# Patient Record
Sex: Female | Born: 1985
Health system: Southern US, Community
[De-identification: ages and names within clinical notes are randomized; demographics above are authoritative.]

---

## 2010-09-22 LAB — BASIC METABOLIC PANEL
BUN: 12 mg/dL (ref 4–21)
Creatinine: 0.8 mg/dL (ref 0.5–1.1)
GLUCOSE: 82 mg/dL
POTASSIUM: 4.4 mmol/L (ref 3.4–5.3)
SODIUM: 138 mmol/L (ref 137–147)

## 2010-09-22 LAB — LIPID PANEL
Cholesterol: 163 mg/dL (ref 0–200)
HDL: 44 mg/dL (ref 35–70)
LDL CALC: 101 mg/dL
Triglycerides: 91 mg/dL (ref 40–160)

## 2014-02-15 ENCOUNTER — Encounter: Payer: Self-pay | Admitting: Physician Assistant

## 2014-02-15 ENCOUNTER — Ambulatory Visit (INDEPENDENT_AMBULATORY_CARE_PROVIDER_SITE_OTHER): Payer: 59 | Admitting: Physician Assistant

## 2014-02-15 VITALS — BP 106/65 | HR 67 | Temp 97.6°F | Resp 16 | Ht 63.0 in | Wt 103.0 lb

## 2014-02-15 DIAGNOSIS — Z131 Encounter for screening for diabetes mellitus: Secondary | ICD-10-CM

## 2014-02-15 DIAGNOSIS — Z0000 Encounter for general adult medical examination without abnormal findings: Secondary | ICD-10-CM

## 2014-02-15 DIAGNOSIS — Z13220 Encounter for screening for lipoid disorders: Secondary | ICD-10-CM

## 2014-02-15 MED ORDER — LEVONORGEST-ETH ESTRAD 91-DAY 0.15-0.03 MG PO TABS: 1.0000 | ORAL_TABLET | Freq: Every day | ORAL | Status: DC

## 2014-02-15 NOTE — Progress Notes (Signed)
  Subjective:     Alice Reese is a 28 y.o. female and is here for a comprehensive physical exam. The patient reports no problems.  History   Social History  . Marital Status: Single    Spouse Name: N/A    Number of Children: N/A  . Years of Education: N/A   Occupational History  . Not on file.   Social History Main Topics  . Smoking status: Never Smoker   . Smokeless tobacco: Not on file  . Alcohol Use: Yes  . Drug Use: No  . Sexual Activity: Not Currently   Other Topics Concern  . Not on file   Social History Narrative  . No narrative on file   Health Maintenance  Topic Date Due  . Pap Smear  05/17/2014  . Influenza Vaccine  12/16/2014  . Tetanus/tdap  05/17/2022    The following portions of the patient's history were reviewed and updated as appropriate: allergies, current medications, past family history, past medical history, past social history, past surgical history and problem list.  Review of Systems A comprehensive review of systems was negative.   Objective:    BP 106/65  Pulse 67  Temp(Src) 97.6 F (36.4 C) (Oral)  Resp 16  Ht 5\' 3"  (1.6 m)  Wt 103 lb (46.72 kg)  BMI 18.25 kg/m2  SpO2 97%  LMP 02/15/2014 General appearance: alert, cooperative and appears stated age Head: Normocephalic, without obvious abnormality, atraumatic Eyes: conjunctivae/corneas clear. PERRL, EOM's intact. Fundi benign. Ears: normal TM's and external ear canals both ears Nose: Nares normal. Septum midline. Mucosa normal. No drainage or sinus tenderness. Throat: lips, mucosa, and tongue normal; teeth and gums normal Neck: no adenopathy, no carotid bruit, no JVD, supple, symmetrical, trachea midline and thyroid not enlarged, symmetric, no tenderness/mass/nodules Back: symmetric, no curvature. ROM normal. No CVA tenderness. Lungs: clear to auscultation bilaterally Heart: regular rate and rhythm, S1, S2 normal, no murmur, click, rub or gallop Abdomen: soft, non-tender; bowel  sounds normal; no masses,  no organomegaly Extremities: extremities normal, atraumatic, no cyanosis or edema Pulses: 2+ and symmetric Skin: Skin color, texture, turgor normal. No rashes or lesions Lymph nodes: Cervical, supraclavicular, and axillary nodes normal. Neurologic: Grossly normal    Assessment:    Healthy female exam.      Plan:    CPE- printed fasting labs. Per pt has had labs drawn for lipids about one year ago.if she can find a bring in she does not need to get redrawn. Up to date on Tdap and flu vaccines. Pap up to date. Recommended calcium 1200mg  and vitamin D 800 units. Encouraged regular exercise.  See After Visit Summary for Counseling Recommendations

## 2014-02-15 NOTE — Patient Instructions (Signed)

## 2014-02-22 ENCOUNTER — Encounter: Payer: Self-pay | Admitting: Physician Assistant

## 2014-02-22 DIAGNOSIS — R748 Abnormal levels of other serum enzymes: Secondary | ICD-10-CM | POA: Insufficient documentation

## 2014-02-22 LAB — COMPLETE METABOLIC PANEL WITH GFR
ALBUMIN: 4 g/dL (ref 3.5–5.2)
ALT: 105 U/L — ABNORMAL HIGH (ref 0–35)
AST: 75 U/L — AB (ref 0–37)
Alkaline Phosphatase: 58 U/L (ref 39–117)
BUN: 19 mg/dL (ref 6–23)
CALCIUM: 8.9 mg/dL (ref 8.4–10.5)
CHLORIDE: 105 meq/L (ref 96–112)
CO2: 26 mEq/L (ref 19–32)
Creat: 0.79 mg/dL (ref 0.50–1.10)
GFR, Est African American: >89 mL/min
GLUCOSE: 80 mg/dL (ref 70–99)
POTASSIUM: 3.9 meq/L (ref 3.5–5.3)
Sodium: 139 mEq/L (ref 135–145)
Total Bilirubin: 0.4 mg/dL (ref 0.2–1.2)
Total Protein: 6.7 g/dL (ref 6.0–8.3)

## 2014-02-22 LAB — LIPID PANEL
CHOLESTEROL: 198 mg/dL (ref 0–200)
HDL: 64 mg/dL (ref >39)
LDL Cholesterol: 115 mg/dL — ABNORMAL HIGH (ref 0–99)
Total CHOL/HDL Ratio: 3.1 Ratio
Triglycerides: 96 mg/dL (ref <150)
VLDL: 19 mg/dL (ref 0–40)

## 2014-05-01 ENCOUNTER — Other Ambulatory Visit: Payer: Self-pay | Admitting: Physician Assistant

## 2014-05-01 DIAGNOSIS — R945 Abnormal results of liver function studies: Principal | ICD-10-CM

## 2014-05-01 DIAGNOSIS — R7989 Other specified abnormal findings of blood chemistry: Secondary | ICD-10-CM

## 2014-05-02 LAB — COMPLETE METABOLIC PANEL WITH GFR
ALK PHOS: 39 U/L (ref 39–117)
ALT: 11 U/L (ref 0–35)
AST: 19 U/L (ref 0–37)
Albumin: 4 g/dL (ref 3.5–5.2)
BUN: 17 mg/dL (ref 6–23)
CO2: 20 mEq/L (ref 19–32)
CREATININE: 0.64 mg/dL (ref 0.50–1.10)
Calcium: 8.8 mg/dL (ref 8.4–10.5)
Chloride: 107 mEq/L (ref 96–112)
GFR, Est African American: >89 mL/min
GLUCOSE: 81 mg/dL (ref 70–99)
Potassium: 4 mEq/L (ref 3.5–5.3)
Sodium: 135 mEq/L (ref 135–145)
TOTAL PROTEIN: 6.7 g/dL (ref 6.0–8.3)
Total Bilirubin: 0.3 mg/dL (ref 0.2–1.2)

## 2014-08-20 ENCOUNTER — Encounter: Payer: Self-pay | Admitting: Physician Assistant

## 2014-09-11 ENCOUNTER — Encounter: Payer: Self-pay | Admitting: Physician Assistant

## 2014-09-11 ENCOUNTER — Ambulatory Visit (INDEPENDENT_AMBULATORY_CARE_PROVIDER_SITE_OTHER): Payer: 59 | Admitting: Physician Assistant

## 2014-09-11 VITALS — BP 104/67 | HR 82 | Ht 63.0 in | Wt 105.0 lb

## 2014-09-11 DIAGNOSIS — B070 Plantar wart: Secondary | ICD-10-CM

## 2014-09-11 MED ORDER — SALICYLIC ACID 26 % EX LIQD: CUTANEOUS | Status: DC

## 2014-09-11 NOTE — Progress Notes (Signed)
   Subjective:    Patient ID: Alice Reese, female    DOB: 01/26/1986, 29 y.o.   MRN: [id number]  HPI Pt presents to the clinic with plantar wart on the lateral heel of patient for past year. She has tried multiple OTC compound W, freezes OTC, and duct tape. She even went to urgent care and they used cryotherapy but the wart just came back. She has tried to cut it off herself but not successful. Causes her pain and irritation.    Review of Systems  All other systems reviewed and are negative.      Objective:   Physical Exam  Skin:             Assessment & Plan:  Plantar wart- will do cryotherapy and suggest to follow up(after healed) with salicylic acid and duct tape. If starting to come back then call and will make dermatology referral for surgical removal.  HO given.   Cryotherapy Procedure Note  Pre-operative Diagnosis: plantar wart   Post-operative Diagnosis: plantar wart  Locations: right lateral heel  Indications:  Pain and irritation   Procedure Details  History of allergy to iodine: no. Pacemaker? no.  Patient informed of risks (permanent scarring, infection, light or dark discoloration, bleeding, infection, weakness, numbness and recurrence of the lesion) and benefits of the procedure and verbal informed consent obtained.  The areas are treated with liquid nitrogen therapy, frozen until ice ball extended 3 mm beyond lesion, allowed to thaw, and treated again. The patient tolerated procedure well.  The patient was instructed on post-op care, warned that there may be blister formation, redness and pain. Recommend OTC analgesia as needed for pain.  Condition: Stable  Complications: none.  Plan: 1. Instructed to keep the area dry and covered for 24-48h and clean thereafter. 2. Warning signs of infection were reviewed.   3. Recommended that the patient use OTC analgesics as needed for pain.

## 2014-09-11 NOTE — Patient Instructions (Signed)
Plantar Warts Warts are benign (noncancerous) growths of the outer skin layer. They can occur at any time in life but are most common during childhood and the teen years. Warts can occur on many skin surfaces of the body. When they occur on the underside (sole) of your foot they are called plantar warts. They often emerge in groups with several small warts encircling a larger growth. CAUSES  Human papillomavirus (HPV) is the cause of plantar warts. HPV attacks a break in the skin of the foot. Walking barefoot can lead to exposure to the wart virus. Plantar warts tend to develop over areas of pressure such as the heel and ball of the foot. Plantar warts often grow into the deeper layers of skin. They may spread to other areas of the sole but cannot spread to other areas of the body. SYMPTOMS  You may also notice a growth on the undersurface of your foot. The wart may grow directly into the sole of the foot, or rise above the surface of the skin on the sole of the foot, or both. They are most often flat from pressure. Warts generally do not cause itching but may cause pain in the area of the wart when you put weight on your foot. DIAGNOSIS  Diagnosis is made by physical examination. This means your caregiver discovers it while examining your foot.  TREATMENT  There are many ways to treat plantar warts. However, warts are very tough. Sometimes it is difficult to treat them so that they go away completely and do not grow back. Any treatment must be done regularly to work. If left untreated, most plantar warts will eventually disappear over a period of one to two years. Treatments you can do at home include:  Putting duct tape over the top of the wart (occlusion) has been found to be effective over several months. The duct tape should be removed each night and reapplied until the wart has disappeared.  Placing over-the-counter medications on top of the wart to help kill the wart virus and remove the wart  tissue (salicylic acid, cantharidin, and dichloroacetic acid) are useful. These are called keratolytic agents. These medications make the skin soft and gradually layers will shed away. These compounds are usually placed on the wart each night and then covered with a bandage. They are also available in premedicated bandage form. Avoid surrounding skin when applying these liquids as these medications can burn healthy skin. The treatment may take several months of nightly use to be effective.  Cryotherapy to freeze the wart has recently become available over-the-counter for children 4 years and older. This system makes use of a soft narrow applicator connected to a bottle of compressed cold liquid that is applied directly to the wart. This medication can burn healthy skin and should be used with caution.  As with all over-the-counter medications, read the directions carefully before use. Treatments generally done in your caregiver's office include:  Some aggressive treatments may cause discomfort, discoloration, and scarring of the surrounding skin. The risks and benefits of treatment should be discussed with your caregiver.  Freezing the wart with liquid nitrogen (cryotherapy, see above).  Burning the wart with use of very high heat (cautery).  Injecting medication into the wart.  Surgically removing or laser treatment of the wart.  Your caregiver may refer you to a dermatologist for difficult to treat large-sized warts or large numbers of warts. HOME CARE INSTRUCTIONS   Soak the affected area in warm water. Dry the   area completely when you are done. Remove the top layer of softened skin, then apply the chosen topical medication and reapply a bandage.  Remove the bandage daily and file excess wart tissue (pumice stone works well for this purpose). Repeat the entire process daily or every other day for weeks until the plantar wart disappears.  Several brands of salicylic acid pads are available  as over-the-counter remedies.  Pain can be relieved by wearing a donut bandage. This is a bandage with a hole in it. The bandage is put on with the hole over the wart. This helps take the pressure off the wart and gives pain relief. To help prevent plantar warts:  Wear shoes and socks and change them daily.  Keep feet clean and dry.  Check your feet and your children's feet regularly.  Avoid direct contact with warts on other people.  Have growths or changes on your skin checked by your caregiver. Document Released: 07/24/2003 Document Revised: 09/17/2013 Document Reviewed: 01/01/2009 [employer] Patient Information 2015 [employer]. This information is not intended to replace advice given to you by your health care provider. Make sure you discuss any questions you have with your health care provider.  

## 2014-12-23 ENCOUNTER — Telehealth: Payer: Self-pay | Admitting: Family Medicine

## 2014-12-23 NOTE — Telephone Encounter (Signed)
It is unusual.  She may have a condition that affects her ability fo ovulate normally. I encourage her to schedul ean appt with Jade at the end of the month to address.

## 2014-12-23 NOTE — Telephone Encounter (Signed)
Pt added onto PCP's schedule.

## 2014-12-23 NOTE — Telephone Encounter (Signed)
Pt called clinic today stating she stopped her Birth Control Rx in April 2016, she had her menstrual cycle in May 2016, and has not had another one since then. Pt questions if this is "normal" as she weans off the Rx? Prior to stopping her Birth Control Rx, Pt would have her cycle every 3 months. Questioned if Pt has taken a home pregnancy test, states "no, there is no way I could be pregnant." Will route to Provider in office for review.

## 2014-12-31 ENCOUNTER — Ambulatory Visit (INDEPENDENT_AMBULATORY_CARE_PROVIDER_SITE_OTHER): Payer: 59 | Admitting: Family Medicine

## 2014-12-31 ENCOUNTER — Ambulatory Visit (INDEPENDENT_AMBULATORY_CARE_PROVIDER_SITE_OTHER): Payer: 59

## 2014-12-31 ENCOUNTER — Encounter: Payer: Self-pay | Admitting: Family Medicine

## 2014-12-31 VITALS — BP 115/78 | HR 52 | Ht 63.0 in | Wt 110.0 lb

## 2014-12-31 DIAGNOSIS — M25562 Pain in left knee: Secondary | ICD-10-CM

## 2014-12-31 NOTE — Progress Notes (Signed)
Quick Note:  Normal, no changes. ______ 

## 2014-12-31 NOTE — Assessment & Plan Note (Signed)
Concerning for lateral meniscus injury. X-ray pending. Recommend rest. Return in 2 weeks for recheck if not better. At that time will likely perform an MRI.

## 2014-12-31 NOTE — Progress Notes (Signed)
Alice Reese is a 29 y.o. female who presents to [facility]  today for left knee pain. Patient is currently training for a triathlon that will be Sunday. About a week and a half ago she tripped and twisted her left knee. She notes lateral knee pain. She has the pain is worse with activity prolonged standing and walking. She has used a knee brace which have little. No fevers chills nausea vomiting or diarrhea. She notes that she is currently limping.   No past medical history on file. No past surgical history on file. Social History  Substance Use Topics  . Smoking status: Never Smoker   . Smokeless tobacco: Not on file  . Alcohol Use: Yes   ROS as above Medications: Current Outpatient Prescriptions  Medication Sig Dispense Refill  . Salicylic Acid 26 % LIQD Apply once daily for 6 weeks. (Patient not taking: Reported on 12/31/2014) 1 Bottle 1   No current facility-administered medications for this visit.   Allergies  Allergen Reactions  . Sulfa Antibiotics Hives     Exam:  BP 115/78 mmHg  Pulse 52  Ht  (1.6 m)  Wt 110 lb (49.896 kg)  BMI 19.49 kg/m2  LMP 12/25/2014 Gen: Well NAD HEENT: EOMI,  MMM Left knee: Normal-appearing no effusion. Mildly tender to palpation lateral joint line. Normal range of motion Negative Lachman's and anterior posterior drawer test. Stable and nontender to valgus and varus stress. Positive lateral McMurray's test. Antalgic gait Exts: Brisk capillary refill, warm and well perfused.   No results found for this or any previous visit (from the past 24 hour(s)). No results found.   Please see individual assessment and plan sections.

## 2014-12-31 NOTE — Patient Instructions (Signed)
Thank you for coming in today. Take up to 2 aleve twice daily for pain as needed.  I recommend against running in the triathlon Sunday Return in 2 weeks if no 100% better  Meniscus Tear with Phase I Rehab The meniscus is a C-shaped cartilage structure, located in the knee joint between the thigh bone (femur) and the shinbone (tibia). Two menisci are located in each knee joint: the inner and outer meniscus. The meniscus acts as an adapter between the thigh bone and shinbone, allowing them to fit properly together. It also functions as a shock absorber, to reduce the stress placed on the knee joint and to help supply nutrients to the knee joint cartilage. As people age, the meniscus begins to harden and become more vulnerable to injury. Meniscus tears are a common injury, especially in older athletes. Inner meniscus tears are more common than outer meniscus tears.  SYMPTOMS   Pain in the knee, especially with standing or squatting with the affected leg.  Tenderness along the joint line.  Swelling in the knee joint (effusion), usually starting 1 to 2 days after injury.  Locking or catching of the knee joint, causing inability to straighten the knee completely.  Giving way or buckling of the knee. CAUSES  A meniscus tear occurs when a force is placed on the meniscus that is greater than it can handle. Common causes of injury include:  Direct hit (trauma) to the knee.  Twisting, pivoting, or cutting (rapidly changing direction while running), kneeling or squatting.  Without injury, due to aging. RISK INCREASES WITH:  Contact sports (football, rugby).  Sports in which cleats are used with pivoting (soccer, lacrosse) or sports in which good shoe grip and sudden change in direction are required (racquetball, basketball, squash).  Previous knee injury.  Associated knee injury, particularly ligament injuries.  Poor strength and flexibility. PREVENTION  Warm up and stretch properly  before activity.  Maintain physical fitness:  Strength, flexibility, and endurance.  Cardiovascular fitness.  Protect the knee with a brace or elastic bandage.  Wear properly fitted protective equipment (proper cleats for the surface). PROGNOSIS  Sometimes, meniscus tears heal on their own. However, definitive treatment requires surgery, followed by at least 6 weeks of recovery.  RELATED COMPLICATIONS   Recurring symptoms that result in a chronic problem.  Repeated knee injury, especially if sports are resumed too soon after injury or surgery.  Progression of the tear (the tear gets larger), if untreated.  Arthritis of the knee in later years (with or without surgery).  Complications of surgery, including infection, bleeding, injury to nerves (numbness, weakness, paralysis) continued pain, giving way, locking, nonhealing of meniscus (if repaired), need for further surgery, and knee stiffness (loss of motion). TREATMENT  Treatment first involves the use of ice and medicine, to reduce pain and inflammation. You may find using crutches to walk more comfortable. However, it is okay to bear weight on the injured knee, if the pain will allow it. Surgery is often advised as a definitive treatment. Surgery is performed through an incision near the joint (arthroscopically). The torn piece of the meniscus is removed, and if possible the joint cartilage is repaired. After surgery, the joint must be restrained. After restraint, it is important to perform strengthening and stretching exercises to help regain strength and a full range of motion. These exercises may be completed at home or with a therapist.  MEDICATION  If pain medicine is needed, nonsteroidal anti-inflammatory medicines (aspirin and ibuprofen), or other minor pain  relievers (acetaminophen), are often advised.  Do not take pain medicine for 7 days before surgery.  Prescription pain relievers may be given, if your caregiver thinks  they are needed. Use only as directed and only as much as you need. HEAT AND COLD  Cold treatment (icing) should be applied for 10 to 15 minutes every 2 to 3 hours for inflammation and pain, and immediately after activity that aggravates your symptoms. Use ice packs or an ice massage.  Heat treatment may be used before performing stretching and strengthening activities prescribed by your caregiver, physical therapist, or athletic trainer. Use a heat pack or a warm water soak. SEEK MEDICAL CARE IF:   Symptoms get worse or do not improve in 2 weeks, despite treatment.  New, unexplained symptoms develop. (Drugs used in treatment may produce side effects.) EXERCISES RANGE OF MOTION (ROM) AND STRETCHING EXERCISES - Meniscus Tear, Non-operative, Phase I These are some of the initial exercises with which you may start your rehabilitation program, until you see your caregiver again or until your symptoms are resolved. Remember:   These initial exercises are intended to be gentle. They will help you restore motion without increasing any swelling.  Completing these exercises allows less painful movement and prepares you for the more aggressive strengthening exercises in Phase II.  An effective stretch should be held for at least 30 seconds.  A stretch should never be painful. You should only feel a gentle lengthening or release in the stretched tissue. RANGE OF MOTION - Knee Flexion, Active  Lie on your back with both knees straight. (If this causes back discomfort, bend your healthy knee, placing your foot flat on the floor.)  Slowly slide your heel back toward your buttocks until you feel a gentle stretch in the front of your knee or thigh.  Hold for __________ seconds. Slowly slide your heel back to the starting position. Repeat __________ times. Complete this exercise __________ times per day.  RANGE OF MOTION - Knee Flexion and Extension, Active-Assisted  Sit on the edge of a table or  chair with your thighs firmly supported. It may be helpful to place a folded towel under the end of your right / left thigh.  Flexion (bending): Place the ankle of your healthy leg on top of the other ankle. Use your healthy leg to gently bend your right / left knee until you feel a mild tension across the top of your knee.  Hold for __________ seconds.  Extension (straightening): Switch your ankles so your right / left leg is on top. Use your healthy leg to straighten your right / left knee until you feel a mild tension on the backside of your knee.  Hold for __________ seconds. Repeat __________ times. Complete __________ times per day. STRETCH - Knee Flexion, Supine  Lie on the floor with your right / left heel and foot lightly touching the wall. (Place both feet on the wall if you do not use a door frame.)  Without using any effort, allow gravity to slide your foot down the wall slowly until you feel a gentle stretch in the front of your right / left knee.  Hold this stretch for __________ seconds. Then return the leg to the starting position, using your healthy leg for help, if needed. Repeat __________ times. Complete this stretch __________ times per day.  STRETCH - Knee Extension Sitting  Sit with your right / left leg/heel propped on another chair, coffee table, or foot stool.  Allow your  leg muscles to relax, letting gravity straighten out your knee.*  You should feel a stretch behind your right / left knee. Hold this position for __________ seconds. Repeat __________ times. Complete this stretch __________ times per day.  *Your physician, physical therapist or athletic trainer may instruct you place a __________ weight on your thigh, just above your kneecap, to deepen the stretch.  STRENGTHENING EXERCISES - Meniscus Tear, Non-operative, Phase I These exercises may help you when beginning to rehabilitate your injury. They may resolve your symptoms with or without further  involvement from your physician, physical therapist or athletic trainer. While completing these exercises, remember:   Muscles can gain both the endurance and the strength needed for everyday activities through controlled exercises.  Complete these exercises as instructed by your physician, physical therapist or athletic trainer. Progress the resistance and repetitions only as guided. STRENGTH - Quadriceps, Isometrics  Lie on your back with your right / left leg extended and your opposite knee bent.  Gradually tense the muscles in the front of your right / left thigh. You should see either your knee cap slide up toward your hip or increased dimpling just above the knee. This motion will push the back of the knee down toward the floor, mat, or bed on which you are lying.  Hold the muscle as tight as you can, without increasing your pain, for __________ seconds.  Relax the muscles slowly and completely between each repetition. Repeat __________ times. Complete this exercise __________ times per day.  STRENGTH - Quadriceps, Short Arcs   Lie on your back. Place a __________ inch towel roll under your right / left knee, so that the knee bends slightly.  Raise only your lower leg by tightening the muscles in the front of your thigh. Do not allow your thigh to rise.  Hold this position for __________ seconds. Repeat __________ times. Complete this exercise __________ times per day.  OPTIONAL ANKLE WEIGHTS: Begin with ____________________, but DO NOT exceed ____________________. Increase in 1 pound/0.5 kilogram increments. STRENGTH - Quadriceps, Straight Leg Raises  Quality counts! Watch for signs that the quadriceps muscle is working, to be sure you are strengthening the correct muscles and not "cheating" by substituting with healthier muscles.  Lay on your back with your right / left leg extended and your opposite knee bent.  Tense the muscles in the front of your right / left thigh. You  should see either your knee cap slide up or increased dimpling just above the knee. Your thigh may even shake a bit.  Tighten these muscles even more and raise your leg 4 to 6 inches off the floor. Hold for __________ seconds.  Keeping these muscles tense, lower your leg.  Relax the muscles slowly and completely in between each repetition. Repeat __________ times. Complete this exercise __________ times per day.  STRENGTH - Hamstring, Curls   Lay on your stomach with your legs extended. (If you lay on a bed, your feet may hang over the edge.)  Tighten the muscles in the back of your thigh to bend your right / left knee up to 90 degrees. Keep your hips flat on the bed.  Hold this position for __________ seconds.  Slowly lower your leg back to the starting position. Repeat __________ times. Complete this exercise __________ times per day.  STRENGTH - Quadriceps, Squats  Stand in a door frame so that your feet and knees are in line with the frame.  Use your hands for balance, not support,  on the frame.  Slowly lower your weight, bending at the hips and knees. Keep your lower legs upright so that they are parallel with the door frame. Squat only within the range that does not increase your knee pain. Never let your hips drop below your knees.  Slowly return upright, pushing with your legs, not pulling with your hands. Repeat __________ times. Complete this exercise __________ times per day.  STRENGTH - Quad/VMO, Isometric   Sit in a chair with your right / left knee slightly bent. With your fingertips, feel the VMO muscle just above the inside of your knee. The VMO is important in controlling the position of your kneecap.  Keeping your fingertips on this muscle. Without actually moving your leg, attempt to drive your knee down as if straightening your leg. You should feel your VMO tense. If you have a difficult time, you may wish to try the same exercise on your healthy knee  first.  Tense this muscle as hard as you can without increasing any knee pain.  Hold for __________ seconds. Relax the muscles slowly and completely in between each repetition. Repeat __________ times. Complete exercise __________ times per day.  Document Released: 05/17/1998 Document Revised: 07/26/2011 Document Reviewed: 08/15/2008 [facility] Patient Information 2015 [place], [place]. This information is not intended to replace advice given to you by your health care provider. Make sure you discuss any questions you have with your health care provider.

## 2015-01-10 ENCOUNTER — Ambulatory Visit: Payer: 59 | Admitting: Physician Assistant

## 2015-01-13 ENCOUNTER — Ambulatory Visit (INDEPENDENT_AMBULATORY_CARE_PROVIDER_SITE_OTHER): Payer: 59 | Admitting: Physician Assistant

## 2015-01-13 ENCOUNTER — Encounter: Payer: Self-pay | Admitting: Physician Assistant

## 2015-01-13 VITALS — BP 109/69 | HR 67 | Ht 63.0 in | Wt 106.0 lb

## 2015-01-13 DIAGNOSIS — Z113 Encounter for screening for infections with a predominantly sexual mode of transmission: Secondary | ICD-10-CM | POA: Diagnosis not present

## 2015-01-13 DIAGNOSIS — N912 Amenorrhea, unspecified: Secondary | ICD-10-CM

## 2015-01-13 DIAGNOSIS — M25562 Pain in left knee: Secondary | ICD-10-CM

## 2015-01-13 NOTE — Progress Notes (Signed)
   Subjective:    Patient ID: Alice Reese, female    DOB: 1985-10-22, 29 y.o.   MRN: [id number]  HPI  Pt presents to the clinic after 4 weeks of left knee pain after a trip while training for triathlon. She came to see Dr. Denyse Amass. He suspected could be a menscial tear. Pain has not improved. She is still having significant problems bearing weight and any twisting causes pain.left knee feels very unstable. Ibuprofen helps some but does not like to take. She is wearing a brace she got at [employer] and helps. After trauma she did complete her triathlon. Her goal is to run a marathon in November.   Pt would like STD testing after getting out of relationship with partner. No vaginal discharge, pain or symptoms of STD's.   Pt did stop birth control in April and only had one period since. Pt is concerned.     Review of Systems  All other systems reviewed and are negative.      Objective:   Physical Exam  Constitutional: She is oriented to person, place, and time. She appears well-developed and well-nourished.  HENT:  Head: Normocephalic and atraumatic.  Cardiovascular: Normal rate, regular rhythm and normal heart sounds.   Pulmonary/Chest: Effort normal and breath sounds normal.  Musculoskeletal:  Left knee:   Normal ROM  No swelling or effusion Lateral joint line tenderness to palpation.  No valgus or varus tenderness and appears stable.  Negative anterior drawer.  Positive McMurray laterally.   Neurological: She is alert and oriented to person, place, and time.  Psychiatric: She has a normal mood and affect. Her behavior is normal.          Assessment & Plan:  Left knee pain- MRI ordered to see if mensical tear. Stay in Brace. Refrain from exercise.   Amenorrhea- likely due to training for triathlon. Will check TSH. Certainly can consider further testing if no period for greater than 1 year.   STD testing- Hiv, RPR, herpes, GC and chlamydia tested today. Will call with  results.

## 2015-01-14 LAB — HSV(HERPES SMPLX)ABS-I+II(IGG+IGM)-BLD
HERPES SIMPLEX VRS I-IGM AB (EIA): 0.76 {index}
HSV 1 Glycoprotein G Ab, IgG: <0.1 IV
HSV 2 Glycoprotein G Ab, IgG: <0.1 IV

## 2015-01-14 LAB — GC/CHLAMYDIA PROBE AMP, URINE
CHLAMYDIA, SWAB/URINE, PCR: NEGATIVE
GC PROBE AMP, URINE: NEGATIVE

## 2015-01-14 LAB — HIV ANTIBODY (ROUTINE TESTING W REFLEX): HIV 1&2 Ab, 4th Generation: NONREACTIVE

## 2015-01-14 LAB — TSH: TSH: 0.704 u[IU]/mL (ref 0.350–4.500)

## 2015-01-14 LAB — RPR

## 2015-01-21 ENCOUNTER — Ambulatory Visit (INDEPENDENT_AMBULATORY_CARE_PROVIDER_SITE_OTHER): Payer: 59

## 2015-01-21 DIAGNOSIS — M25562 Pain in left knee: Secondary | ICD-10-CM | POA: Diagnosis not present

## 2015-01-23 ENCOUNTER — Telehealth: Payer: Self-pay

## 2015-01-23 NOTE — Telephone Encounter (Signed)
Patient called for Xray results.

## 2015-01-24 ENCOUNTER — Other Ambulatory Visit: Payer: Self-pay | Admitting: Physician Assistant

## 2015-01-24 ENCOUNTER — Encounter: Payer: Self-pay | Admitting: Physician Assistant

## 2015-01-24 DIAGNOSIS — M94269 Chondromalacia, unspecified knee: Secondary | ICD-10-CM | POA: Insufficient documentation

## 2015-01-24 DIAGNOSIS — M94262 Chondromalacia, left knee: Secondary | ICD-10-CM

## 2015-01-24 NOTE — Telephone Encounter (Signed)
Sent results to [facility] to call.

## 2015-02-05 ENCOUNTER — Encounter: Payer: Self-pay | Admitting: Physical Therapy

## 2015-02-05 ENCOUNTER — Ambulatory Visit (INDEPENDENT_AMBULATORY_CARE_PROVIDER_SITE_OTHER): Payer: 59 | Admitting: Physical Therapy

## 2015-02-05 DIAGNOSIS — R269 Unspecified abnormalities of gait and mobility: Secondary | ICD-10-CM

## 2015-02-05 DIAGNOSIS — M79662 Pain in left lower leg: Secondary | ICD-10-CM | POA: Diagnosis not present

## 2015-02-05 DIAGNOSIS — M25562 Pain in left knee: Secondary | ICD-10-CM | POA: Diagnosis not present

## 2015-02-05 DIAGNOSIS — R29898 Other symptoms and signs involving the musculoskeletal system: Secondary | ICD-10-CM | POA: Diagnosis not present

## 2015-02-05 DIAGNOSIS — M7989 Other specified soft tissue disorders: Secondary | ICD-10-CM

## 2015-02-05 NOTE — Therapy (Signed)
[facility] [place] [address] [place], [place], [address] Phone: [phone]   Fax:  [phone]  Physical Therapy Evaluation  Patient Details  Name: Alice Reese MRN: [id number] Date of Birth: [birth year] Referring Provider:  Jomarie Longs, PA-C  Encounter Date: 02/05/2015      PT End of Session - 02/05/15 1009    Visit Number 1   Number of Visits 4   Date for PT Re-Evaluation 03/05/15   PT Start Time 0933   PT Stop Time 1019   PT Time Calculation (min) 46 min      History reviewed. No pertinent past medical history.  History reviewed. No pertinent past surgical history.  There were no vitals filed for this visit.  Visit Diagnosis:  Knee pain, acute, left - Plan: PT plan of care cert/re-cert  Weakness of left leg - Plan: PT plan of care cert/re-cert  Abnormality of gait - Plan: PT plan of care cert/re-cert  Pain and swelling of lower leg, left - Plan: PT plan of care cert/re-cert      Subjective Assessment - 02/05/15 0931    Subjective Pt reports she is not sure if she need to be coming as she thinks she is getting better.  After working 12 hrs she has a lot of pain and she is unable to run right now. Pt fell while running back in July, had a pop and has had difficulty since.     Diagnostic tests MRI - show effusion and chondromalcia   Patient Stated Goals run and work without pain   Currently in Pain? Yes   Pain Score 1    Pain Location Knee   Pain Orientation Left   Pain Type Acute pain   Pain Radiating Towards lateral Lt knee occassionally medial    Pain Onset More than a month ago   Pain Frequency Intermittent   Aggravating Factors  running and stairs   Pain Relieving Factors rest            [facility] PT Assessment - 02/05/15 0001    Assessment   Medical Diagnosis Lt knee chondromalacia   Onset Date/Surgical Date 12/04/14   Hand Dominance Right   Next MD Visit not scheduled   Prior Therapy  none   Precautions   Precautions None   Precaution Comments rest for a couple more weeks   Restrictions   Weight Bearing Restrictions No   Balance Screen   Has the patient fallen in the past 6 months No   Has the patient had a decrease in activity level because of a fear of falling?  No   Is the patient reluctant to leave their home because of a fear of falling?  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   Leisure run    Observation/Other Assessments   Focus on Therapeutic Outcomes (FOTO)  43% limited   Posture/Postural Control   Posture Comments (+) edema   ROM / Strength   AROM / PROM / Strength AROM;Strength   AROM   Overall AROM Comments LE ROM WNL, hyperflexible in Lt HS and quad   Strength   Overall Strength Comments Rt LE WNL   Strength Assessment Site Knee;Hip   Right/Left Hip Left   Left Hip Flexion 5/5   Left Hip Extension --  5-/5   Left Hip ABduction 4/5   Right/Left Knee Left   Left Knee Flexion 4+/5   Left Knee Extension 5/5   Palpation   Patella mobility WNL, bilat sit high   Palpation comment no point tenderness in the Lt knee   Ambulation/Gait   Gait Pattern Antalgic;Decreased hip/knee flexion - left;Decreased stance time - left                   [facility] Adult PT Treatment/Exercise - 02/05/15 0001    Exercises   Exercises Knee/Hip   Knee/Hip Exercises: Standing   Forward Step Up Left;10 reps;Step Height: 8"  Rt heel taps down   SLS 2x10 with FWD leans on Lt LE   Knee/Hip Exercises: Supine   Straight Leg Raise with External Rotation Strengthening;Left;3 sets;10 reps   Knee/Hip Exercises: Sidelying   Hip ABduction Strengthening;Left;3 sets;10 reps   Modalities   Modalities Vasopneumatic   Vasopneumatic   Number Minutes Vasopneumatic  10 minutes   Vasopnuematic Location  Knee   Lt   Vasopneumatic Pressure Medium   Vasopneumatic Temperature  3*                PT Education - 02/05/15 1017    Education provided Yes   Education Details HEP and knee mechanics, effect of weakness with running    Person(s) Educated Patient   Methods Explanation;Demonstration;Handout   Comprehension Returned demonstration;Verbalized understanding             PT Long Term Goals - 02/05/15 1023    PT LONG TERM GOAL #1   Title i with HEP ( 03/05/15)    Time 4   Period Weeks   Status New   PT LONG TERM GOAL #2   Title report Lt knee pain decrease to allow interval running (03/05/15)    Time 4   Period Weeks   Status New   PT LONG TERM GOAL #3   Title increase strength Lt hip abduction =/> 5-/5 ( 03/05/15)    Time 4   Period Weeks   Status New   PT LONG TERM GOAL #4   Title demo normalized gait patterns ( 03/05/15)    Time 4   Period Weeks   Status New   PT LONG TERM GOAL #5   Title improve FOTO =.< 29% limited ( 03/05/15)    Time 4   Period Weeks   Status New                Problem List Patient Active Problem List   Diagnosis Date Noted  . Chondromalacia of medial femoral condyle 01/24/2015  . Amenorrhea 01/13/2015  . Left knee pain 12/31/2014  . Plantar wart, right foot 09/11/2014  . Elevated liver enzymes 02/22/2014    Roderic Scarce PT 02/05/2015, 10:27 AM  [facility] [address] [place], [place], [address] Phone: [phone]   Fax:  [phone]

## 2015-02-05 NOTE — Patient Instructions (Signed)
Straight Leg Raise: With External Leg Rotation   [place] [phone]    Lie on back with right leg straight, opposite leg bent. Rotate straight leg out and lift _10-12___ inches. Repeat 10-15__ times per set. Do __3__ sets per session. Do ___1_ sessions per day.  Strengthening: Hip Abduction (Side-Lying)   Tighten muscles on front of left thigh, then lift leg ____ inches from surface, keeping knee locked.  Repeat ____ times per set. Do ____ sets per session. Do ____ sessions per day.  Strengthening: Hip Abduction (Side-Lying)   Tighten muscles on front of left thigh, then lift leg _12___ inches from surface, keeping knee locked.  Repeat _10-15___ times per set. Do __3__ sets per session. Do __1__ sessions per day.  Balance: Unilateral - Forward Lean   Stand on left foot, hands on hips. Keeping hips level, bend forward as if to touch forehead to wall. Hold _1___ seconds. Relax. Repeat _10___ times per set. Do __1-3__ sets per session. Do __1__ sessions per day.   Proprioception, Coordination, Quad Strength: Retro Step-Up   Standing on step, tap right heel down then slowly return to top of step. Keep pelvis level, don't let right side drop down.  Use _6-8___ inch step. Repeat _10___ times Do __1__ sessions per day.  Copyright  VHI. All rights reserved.

## 2015-02-12 ENCOUNTER — Encounter: Payer: 59 | Admitting: Rehabilitative and Restorative Service Providers"

## 2015-02-12 ENCOUNTER — Encounter: Payer: 59 | Admitting: Physical Therapy

## 2015-02-13 ENCOUNTER — Ambulatory Visit (INDEPENDENT_AMBULATORY_CARE_PROVIDER_SITE_OTHER): Payer: 59 | Admitting: Physical Therapy

## 2015-02-13 DIAGNOSIS — M79662 Pain in left lower leg: Secondary | ICD-10-CM

## 2015-02-13 DIAGNOSIS — R269 Unspecified abnormalities of gait and mobility: Secondary | ICD-10-CM

## 2015-02-13 DIAGNOSIS — M25562 Pain in left knee: Secondary | ICD-10-CM

## 2015-02-13 DIAGNOSIS — R29898 Other symptoms and signs involving the musculoskeletal system: Secondary | ICD-10-CM

## 2015-02-13 DIAGNOSIS — M7989 Other specified soft tissue disorders: Secondary | ICD-10-CM

## 2015-02-13 NOTE — Therapy (Signed)
[facility] [place], [place], [address] Phone: [phone]   Fax:  [phone]  Physical Therapy Treatment  Patient Details  Name: Alice Reese MRN: [id number] Date of Birth: April 24, 1986 Referring Provider:  Donella Stade, PA-C  Encounter Date: 02/13/2015      PT End of Session - 02/13/15 1533    Visit Number 2   Number of Visits 4   Date for PT Re-Evaluation 03/05/15   PT Start Time 1532   PT Stop Time 1618   PT Time Calculation (min) 46 min      No past medical history on file.  No past surgical history on file.  There were no vitals filed for this visit.  Visit Diagnosis:  Knee pain, acute, left  Weakness of left leg  Abnormality of gait  Pain and swelling of lower leg, left      Subjective Assessment - 02/13/15 1536    Currently in Pain? No/denies  only when performing impact 4/10   Pain Location Knee   Pain Orientation Left;Lateral   Pain Type --  "irritating"             [facility] PT Assessment - 02/13/15 0001    Assessment   Medical Diagnosis Lt knee chondromalacia   Onset Date/Surgical Date 12/04/14   Hand Dominance Right   Next MD Visit PRN   Prior Therapy none             [facility] Adult PT Treatment/Exercise - 02/13/15 0001    Knee/Hip Exercises: Stretches   Passive Hamstring Stretch Right;Left;2 reps;30 seconds   Other Knee/Hip Stretches ITB stretch with strap x 30 sec x 2 reps each leg.    Other Knee/Hip Stretches crossed legs touching toes x 20 sec x 2 reps each side (standing)   Knee/Hip Exercises: Supine   Straight Leg Raise with External Rotation Strengthening;Left;2 sets;10 reps  (on elbows)   Other Supine Knee/Hip Exercises long sitting: SLR with hip abd/add x 10 reps, repeated on elbows LLE only. 2 sets   Knee/Hip Exercises: Sidelying   Clams 1 set of 15 reps, each side   Manual Therapy   Manual Therapy Other (comment);Taping   Other Manual Therapy  Instrument assisted Soft tissue massage to Lt lateral quad / ITB.  Followed by taping to Lt lateral patella to assist in proper tracking and decreased pain.      Bicycle: L2 x 7 min  Forward leans to chair seat x 8 reps each leg.  Challenging.         PT Education - 02/13/15 1805    Education provided Yes   Education Details HEP - added ITB stretch with strap, SLR with hip abd/add, clam shells   Person(s) Educated Patient   Methods Explanation;Handout   Comprehension Verbalized understanding;Returned demonstration             PT Long Term Goals - 02/05/15 1023    PT LONG TERM GOAL #1   Title i with HEP ( 03/05/15)    Time 4   Period Weeks   Status New   PT LONG TERM GOAL #2   Title report Lt knee pain decrease to allow interval running (03/05/15)    Time 4   Period Weeks   Status New   PT LONG TERM GOAL #3   Title increase strength Lt hip abduction =/> 5-/5 ( 03/05/15)    Time 4   Period Weeks   Status New  PT LONG TERM GOAL #4   Title demo normalized gait patterns ( 03/05/15)    Time 4   Period Weeks   Status New   PT LONG TERM GOAL #5   Title improve FOTO =.< 29% limited ( 03/05/15)    Time 4   Period Weeks   Status New               Plan - 02/13/15 1805    Clinical Impression Statement Pt continues to walk with antalgic gait.  Pt tolerated all exercises with minimal to no increase in Lt knee pain.  Pt tight in Lt ITB, weak in Lt hip abd.  Noted restrictions in Lt ITB and lateral quad junction with manual. No goals met yet.      Pt will benefit from skilled therapeutic intervention in order to improve on the following deficits Decreased strength;Pain;Decreased activity tolerance  added in by PT Jeral Pinch, PT    Rehab Potential Excellent   PT Frequency 1x / week   PT Duration 4 weeks   PT Treatment/Interventions Ultrasound;Neuromuscular re-education;Cryotherapy;Patient/family education;Dry needling;Electrical Stimulation;Moist Heat;Therapeutic  exercise;Manual techniques;Vasopneumatic Device;Taping  added by PT Jeral Pinch, PT   PT Next Visit Plan Reassess response to tape.  Gait training.  Continue strengthening for Lt quad.    Consulted and Agree with Plan of Care Patient        Problem List Patient Active Problem List   Diagnosis Date Noted  . Chondromalacia of medial femoral condyle 01/24/2015  . Amenorrhea 01/13/2015  . Left knee pain 12/31/2014  . Plantar wart, right foot 09/11/2014  . Elevated liver enzymes 02/22/2014    Kerin Perna, PTA 02/13/2015 6:17 PM   Jeral Pinch, PT 02/13/2015 6:17 PM   [facility] [place], [place], [address] Phone: [phone]   Fax:  [phone]

## 2015-02-13 NOTE — Patient Instructions (Signed)
Abduction: Clam (Eccentric) - Side-Lying    Lie on side with knees bent. Lift top knee, keeping feet together. Keep trunk steady. Slowly lower for 3-5 seconds. _10__ reps per set, _15__ sets per day, ___ days per week.   Outer Hip Stretch: Reclined IT Band Stretch (Strap)    Strap around opposite foot, pull across only as far as possible with shoulders on mat. Hold for __30__seconds. Repeat _2___ times each leg.  Copyright  VHI. All rights reserved.

## 2015-02-17 ENCOUNTER — Ambulatory Visit (INDEPENDENT_AMBULATORY_CARE_PROVIDER_SITE_OTHER): Payer: 59 | Admitting: Physical Therapy

## 2015-02-17 DIAGNOSIS — R29898 Other symptoms and signs involving the musculoskeletal system: Secondary | ICD-10-CM | POA: Diagnosis not present

## 2015-02-17 DIAGNOSIS — R269 Unspecified abnormalities of gait and mobility: Secondary | ICD-10-CM

## 2015-02-17 NOTE — Therapy (Signed)
[facility] [place] [address] [place], [place], [address] Phone: [phone]   Fax:  [phone]  Physical Therapy Treatment  Patient Details  Name: Alice Reese MRN: [id number] Date of Birth: 04/11/1986 Referring Provider:  Jomarie Longs, PA-C  Encounter Date: 02/17/2015      PT End of Session - 02/17/15 1023    Visit Number 3   Number of Visits 4   Date for PT Re-Evaluation 03/05/15   PT Start Time 1020   PT Stop Time 1103   PT Time Calculation (min) 43 min   Activity Tolerance Patient tolerated treatment well      No past medical history on file.  No past surgical history on file.  There were no vitals filed for this visit.  Visit Diagnosis:  Weakness of left leg  Abnormality of gait      Subjective Assessment - 02/17/15 1024    Subjective Pt reports she still had pain and was limping when she attempted to run. No skin irritation with Rock tape, pt felt it was beneficial.    Currently in Pain? No/denies  Only pain 4/10 with short running trials.             [facility] PT Assessment - 02/17/15 0001    Assessment   Medical Diagnosis Lt knee chondromalacia   Onset Date/Surgical Date 12/04/14   Hand Dominance Right   Next MD Visit PRN   Strength   Strength Assessment Site Hip;Knee   Left Hip Flexion 5/5   Left Hip Extension --  5-/5   Left Hip ABduction 4+/5   Right/Left Knee Left   Left Knee Flexion 5/5   Left Knee Extension 5/5           [facility] Adult PT Treatment/Exercise - 02/17/15 0001    Knee/Hip Exercises: Stretches   Passive Hamstring Stretch Left;2 reps;30 seconds   Other Knee/Hip Stretches ITB stretch with strap x 30 sec x 2 reps each leg.    Knee/Hip Exercises: Aerobic   Nustep L4: 5 min    Knee/Hip Exercises: Standing   Lateral Step Up Left;1 set;5 reps   Lateral Step Up Limitations (cross over step ups)    Step Down 1 set;Step Height: 6";10 reps  Rt heel taps   SLS --   Other  Standing Knee Exercises side stepping with blue band around ankles x 20 ft each direction    Knee/Hip Exercises: Supine   Bridges with Ball Squeeze 1 set;10 reps  5 sec ball squeeze   Other Supine Knee/Hip Exercises long sitting: SLR with hip abd/add x 10 reps, repeated on elbows LLE only   Knee/Hip Exercises: Sidelying   Other Sidelying Knee/Hip Exercises clam shell/donkey kick x 15 each leg.            PT Long Term Goals - 02/17/15 1115    PT LONG TERM GOAL #1   Title i with HEP ( 03/05/15)    Time 4   Period Weeks   Status On-going   PT LONG TERM GOAL #2   Title report Lt knee pain decrease to allow interval running (03/05/15)    Time 4   Period Weeks   Status On-going   PT LONG TERM GOAL #3   Title increase strength Lt hip abduction =/> 5-/5 ( 03/05/15)    Time 4   Period Weeks   Status On-going   PT LONG TERM GOAL #4   Title demo normalized gait patterns (  03/05/15)    Time 4   Period Weeks   Status On-going   PT LONG TERM GOAL #5   Title improve FOTO =.< 29% limited ( 03/05/15)    Time 4   Period Weeks   Status On-going               Plan - 02/17/15 1103    Clinical Impression Statement Pt continues to walk with slight limp, although unrecognized by pt. Pt noted slight decrease in Lt knee pain with running after tape application.  Pt reported pain in lateral Lt knee with resisted side stepping; switching to sidelying hip strengthening.  Pt demo improved Lt hip abd strength, not quite equal to Rt side.    Pt will benefit from skilled therapeutic intervention in order to improve on the following deficits Decreased strength;Pain;Decreased activity tolerance   Rehab Potential Excellent   PT Frequency 1x / week   PT Duration 4 weeks   PT Treatment/Interventions Ultrasound;Neuromuscular re-education;Cryotherapy;Patient/family education;Dry needling;Electrical Stimulation;Moist Heat;Therapeutic exercise;Manual techniques;Vasopneumatic Device;Taping   PT Next  Visit Plan Reassess response to tape (Rock tape vs dynamic tape).  Gait training.  Continue strengthening for Lt quad, hip abductors. Assess need for additional therapy vs d/c.  (may instruct pt on taping if beneficial)   Consulted and Agree with Plan of Care Patient        Problem List Patient Active Problem List   Diagnosis Date Noted  . Chondromalacia of medial femoral condyle 01/24/2015  . Amenorrhea 01/13/2015  . Left knee pain 12/31/2014  . Plantar wart, right foot 09/11/2014  . Elevated liver enzymes 02/22/2014   Mayer Camel, PTA 02/17/2015 11:25 AM  [facility] [address] [place], [place], [address] Phone: [phone]   Fax:  [phone]

## 2015-02-24 ENCOUNTER — Ambulatory Visit (INDEPENDENT_AMBULATORY_CARE_PROVIDER_SITE_OTHER): Payer: 59 | Admitting: Physical Therapy

## 2015-02-24 DIAGNOSIS — R29898 Other symptoms and signs involving the musculoskeletal system: Secondary | ICD-10-CM | POA: Diagnosis not present

## 2015-02-24 DIAGNOSIS — M25562 Pain in left knee: Secondary | ICD-10-CM | POA: Diagnosis not present

## 2015-02-24 NOTE — Patient Instructions (Signed)
Clam    Lie on side, legs bent 90. Open top knee to ceiling, rotating leg outward. Donkey kick to the back, keep back straight. Maintain hip position. Repeat __10__ times, 2-3 sets. Repeat on other side. Do _1___ sessions per day.  [email or web address]   Dynamic stretches prior to runs:  Forward/backward kicks, side to side kicks (windshield wipers), inner and outer heel taps.  (10-each)  Static stretches afterward.

## 2015-02-24 NOTE — Therapy (Addendum)
[facility] [place], [place], [address] Phone: [phone]   Fax:  [phone]  Physical Therapy Treatment  Patient Details  Name: Alice Reese MRN: [id number] Date of Birth: July 08, 1985 Referring Provider:  Donella Stade, PA-C  Encounter Date: 02/24/2015      PT End of Session - 02/24/15 1016    Visit Number 4   Number of Visits 4   Date for PT Re-Evaluation 03/05/15   PT Start Time 1025   PT Stop Time 8527   PT Time Calculation (min) 44 min   Activity Tolerance Patient tolerated treatment well;No increased pain      No past medical history on file.  No past surgical history on file.  There were no vitals filed for this visit.  Visit Diagnosis:  Weakness of left leg  Knee pain, acute, left      Subjective Assessment - 02/24/15 1016    Subjective Pt reported she notices a little improvement in Lt knee with tape application.  Is interested in learning application.  Pt reports she ran 2 miles on treadmill last night,  pain at end of run and continued after run.  Pain eventually subsided with rest.    Currently in Pain? Yes   Pain Score 2    Pain Orientation Left;Medial   Pain Descriptors / Indicators Dull            [facility] PT Assessment - 02/24/15 0001    Assessment   Medical Diagnosis Lt knee chondromalacia   Onset Date/Surgical Date 12/04/14   Hand Dominance Right   Next MD Visit PRN   Observation/Other Assessments   Focus on Therapeutic Outcomes (FOTO)  17% limited   Strength   Strength Assessment Site Hip;Knee   Left Hip Flexion 5/5   Left Hip Extension 5/5   Left Hip ABduction 5/5            [facility] Adult PT Treatment/Exercise - 02/24/15 0001    Knee/Hip Exercises: Stretches   Passive Hamstring Stretch Left;2 reps;60 seconds   Quad Stretch 2 reps;Left;Right;30 seconds   Other Knee/Hip Stretches ITB stretch with strap x 30 sec x 2 reps each leg, adductor stretch with  strap 30 sec x 2    Knee/Hip Exercises: Aerobic   Elliptical L3: 4 min, repeated after taped   Knee/Hip Exercises: Standing   Step Down 1 set;Step Height: 6";10 reps  Rt heel taps   SLS Forward leans to 6" step x 5 each side    Other Standing Knee Exercises Dynamic stretches each leg: forward /backward kicks, side to side kicks, inner and outer heel taps - 10 of each on Rt/Lt leg   Manual Therapy   Manual Therapy Other (comment);Taping   Other Manual Therapy Rock tape applied to pes anserine Lt to decompress area and decrease pain. Pt instructed on how to tape Lt knee for improved patellar tracking.  Pt able to verbalize understanding and verbalize process.,     sidelying clam/donkey kicks x 10 reps each side; req tactile cues.        PT Long Term Goals - 02/24/15 1051    PT LONG TERM GOAL #1   Title i with HEP ( 03/05/15)    Time 4   Period Weeks   Status Achieved   PT LONG TERM GOAL #2   Title report Lt knee pain decrease to allow interval running (03/05/15)    Time 4   Period Weeks  Status Achieved   PT LONG TERM GOAL #3   Title increase strength Lt hip abduction =/> 5-/5 ( 03/05/15)    Time 4   Period Weeks   Status Achieved   PT LONG TERM GOAL #4   Title demo normalized gait patterns ( 03/05/15)    Time 4   Period Weeks   Status Achieved   PT LONG TERM GOAL #5   Title improve FOTO =.< 29% limited ( 03/05/15)    Time 4   Period Weeks   Status Achieved               Plan - 02/24/15 1308    Clinical Impression Statement Pt demo improved hip strength and decreased pain in Lt knee overall.  Pt has met all goals and is satisfied with current level of function. Pt requests to d/c to HEP.    Pt will benefit from skilled therapeutic intervention in order to improve on the following deficits Decreased strength;Pain;Decreased activity tolerance   PT Frequency 1x / week   PT Duration 4 weeks   PT Treatment/Interventions Ultrasound;Neuromuscular  re-education;Cryotherapy;Patient/family education;Dry needling;Electrical Stimulation;Moist Heat;Therapeutic exercise;Manual techniques;Vasopneumatic Device;Taping   PT Next Visit Plan Spoke to supervising PT; will d/c to HEP.         Problem List Patient Active Problem List   Diagnosis Date Noted  . Chondromalacia of medial femoral condyle 01/24/2015  . Amenorrhea 01/13/2015  . Left knee pain 12/31/2014  . Plantar wart, right foot 09/11/2014  . Elevated liver enzymes 02/22/2014    Kerin Perna, PTA 02/24/2015 1:11 PM  [facility] [place], [place], [address] Phone: [phone]   Fax:  [phone]     PHYSICAL THERAPY DISCHARGE SUMMARY  Visits from Start of Care: 4  Current functional level related to goals / functional outcomes: See above   Remaining deficits: none   Education / Equipment: HEP  Plan: Patient agrees to discharge.  Patient goals were met. Patient is being discharged due to meeting the stated rehab goals.  ?????       Jeral Pinch, PT 04/03/2015 10:56 AM

## 2015-07-11 MED FILL — AZITHROMYCIN 250 MG TABLET: 250 | 5 days supply | Qty: 6 | Fill #0

## 2016-06-07 ENCOUNTER — Encounter: Payer: Self-pay | Admitting: Physician Assistant

## 2016-06-07 ENCOUNTER — Ambulatory Visit (INDEPENDENT_AMBULATORY_CARE_PROVIDER_SITE_OTHER): Payer: Managed Care, Other (non HMO) | Admitting: Physician Assistant

## 2016-06-07 VITALS — BP 100/65 | HR 72 | Ht 63.0 in | Wt 110.0 lb

## 2016-06-07 DIAGNOSIS — F902 Attention-deficit hyperactivity disorder, combined type: Secondary | ICD-10-CM

## 2016-06-07 MED ORDER — METHYLPHENIDATE HCL ER (OSM) 27 MG PO TBCR: 27.0000 mg | EXTENDED_RELEASE_TABLET | ORAL | 0 refills | Status: DC

## 2016-06-07 MED ORDER — METHYLPHENIDATE HCL ER (OSM) 27 MG PO TBCR: 27.0000 mg | EXTENDED_RELEASE_TABLET | ORAL | 0 refills | Status: AC

## 2016-06-07 NOTE — Progress Notes (Signed)
   Subjective:    Patient ID: Alice Reese, female    DOB: 01/16/1986, 31 y.o.   MRN: [id number]  HPI  Pt is a 31 yo female with hx of ADHD and treated with concerta who presents to the clinic to get restarted on medication. She had been doing well and controlled her focus with exercise, good rest, and eating well. She moved to [place] and moved in with roommates that have caused a lot of stress. She has not been on concerta in 3 years but feels she needs to go back on for a few months until she can hopefully start eating, sleeping, exercising better. She has noticed most of her difficulty is new job and focusing during training. She remembers have mood swings on adderall.    Review of Systems  All other systems reviewed and are negative.      Objective:   Physical Exam  Constitutional: She is oriented to person, place, and time. She appears well-developed and well-nourished.  HENT:  Head: Normocephalic and atraumatic.  Neck: Normal range of motion. Neck supple.  Cardiovascular: Normal rate, regular rhythm and normal heart sounds.   Pulmonary/Chest: Effort normal and breath sounds normal. She has no wheezes.  Lymphadenopathy:    She has no cervical adenopathy.  Neurological: She is alert and oriented to person, place, and time.  Psychiatric: She has a normal mood and affect. Her behavior is normal.          Assessment & Plan:  Marland Kitchen.Marland Kitchen.Alice Reese was seen today for adhd.  Diagnoses and all orders for this visit:  Attention deficit hyperactivity disorder (ADHD), combined type -     methylphenidate 27 MG PO CR tablet; Take 1 tablet (27 mg total) by mouth every morning.  Other orders -     Discontinue: methylphenidate 27 MG PO CR tablet; Take 1 tablet (27 mg total) by mouth every morning.   Follow up in 2 months.

## 2016-06-10 ENCOUNTER — Telehealth: Payer: Self-pay | Admitting: *Deleted

## 2016-06-10 NOTE — Telephone Encounter (Signed)
PA submitted.

## 2016-06-10 NOTE — Telephone Encounter (Signed)
Patient called back and states she has had symptoms before age 31.

## 2016-06-10 NOTE — Telephone Encounter (Signed)
Trying to initiate a PA through covermymeds. One the of the questions asked is have the patient's symptoms been present prior to 31 years of age. Called and left message on patient's vm asking her to provide this information

## 2016-06-22 NOTE — Telephone Encounter (Signed)
PA approved via cover my meds. Pharmacy notified and will contact pt.

## 2016-08-24 IMAGING — CR DG KNEE COMPLETE 4+V*L*
4 series · 4 of 4 positions shown · non-contrast
Comparison: None.

CLINICAL DATA: Fall while running 2 weeks ago. Pain anterior
lateral left knee.

EXAM:
LEFT KNEE - COMPLETE 4+ VIEW

[knee ap (1 of 3)]
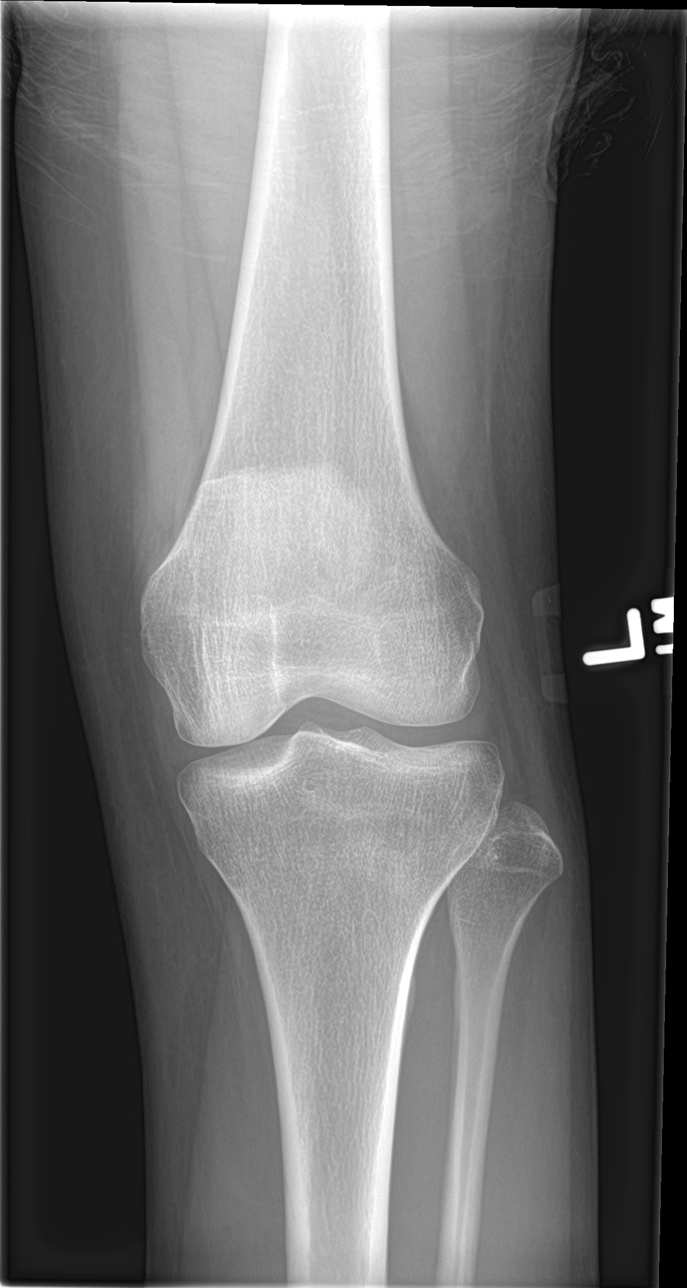

[knee lat]
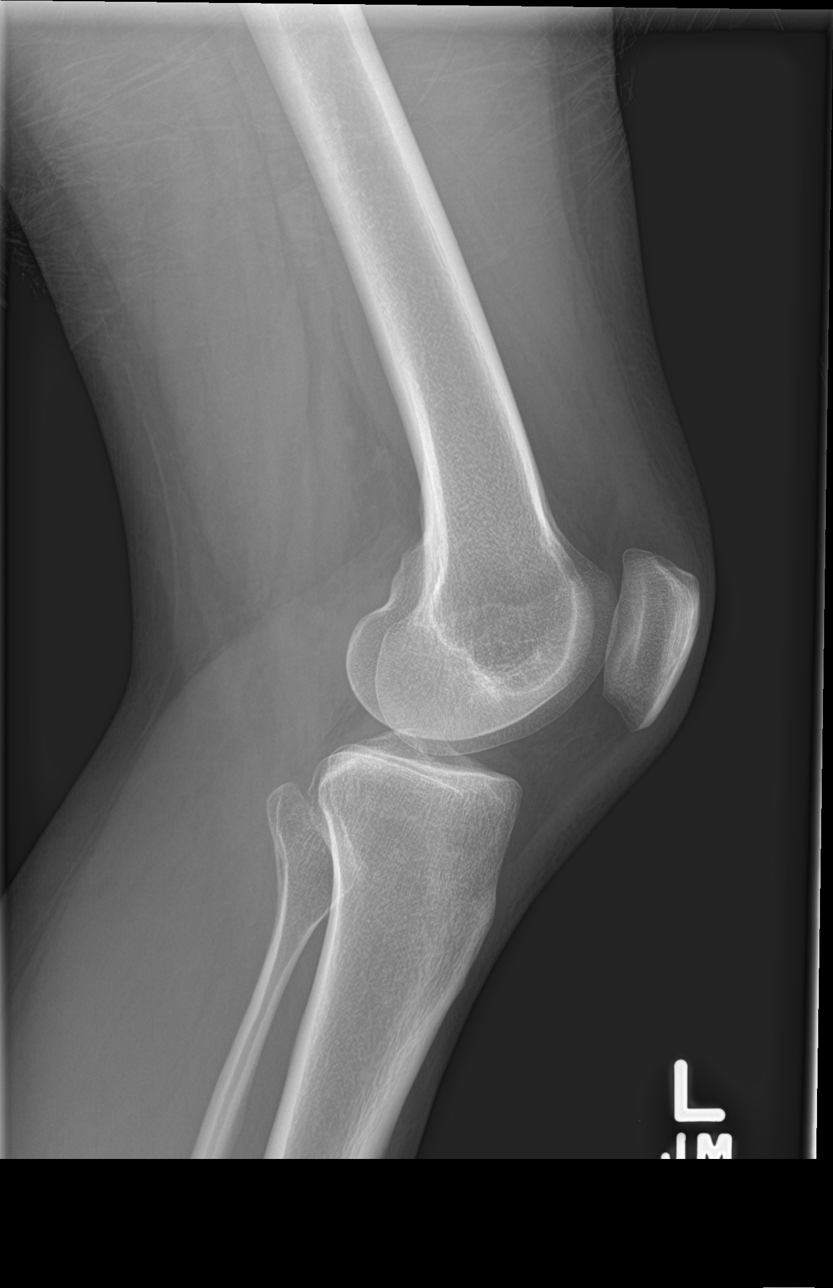

[knee ap (2 of 3)]
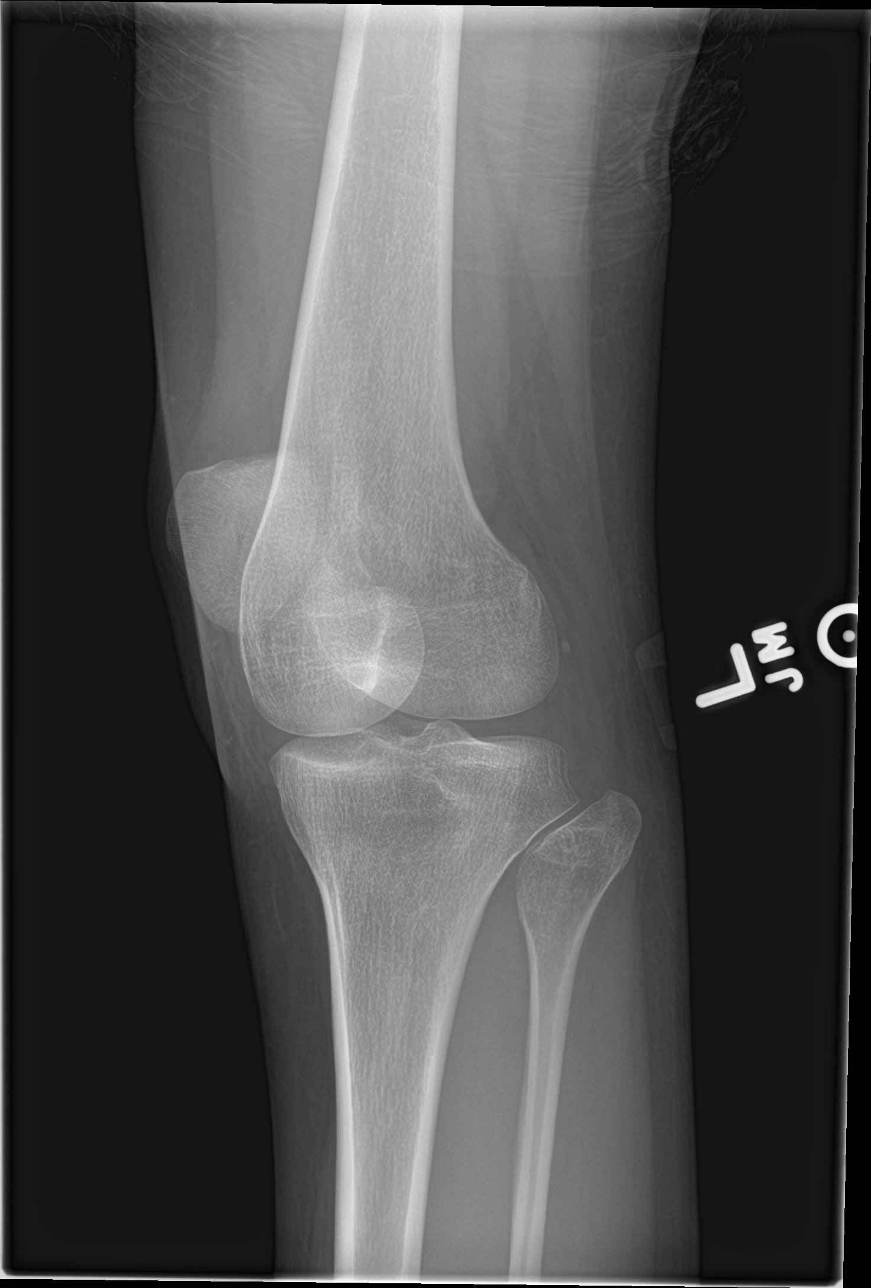

[knee ap (3 of 3)]
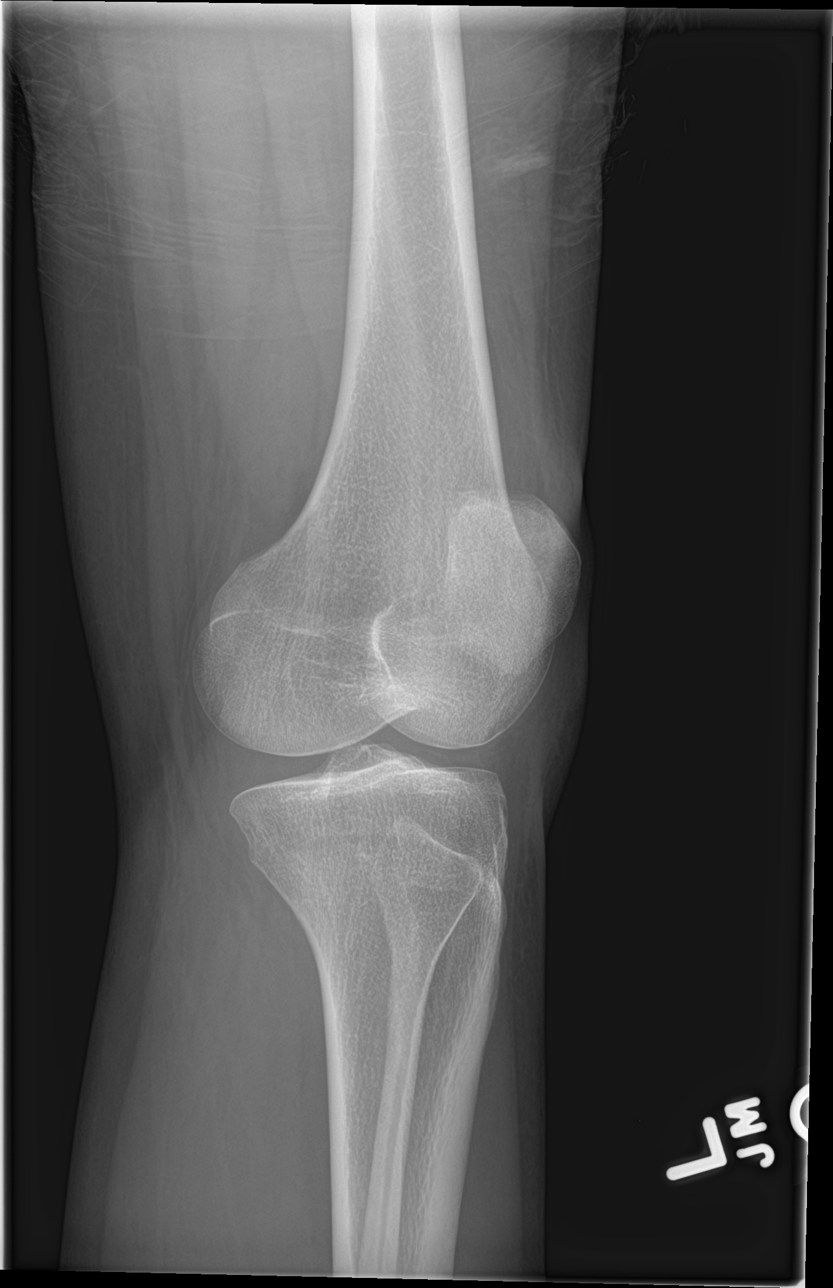

[4 of 4 positions shown; findings below may reference images not displayed]

FINDINGS: There is no evidence of fracture, dislocation, or joint effusion.
There is no evidence of arthropathy or other focal bone abnormality.
Soft tissues are unremarkable.
IMPRESSION: Negative.
